# Patient Record
Sex: Female | Born: 1975 | Race: Black or African American | Hispanic: No | Marital: Single | Smoking: Never smoker
Health system: Southern US, Community
[De-identification: ages and names within clinical notes are randomized; demographics above are authoritative.]

## PROBLEM LIST (undated history)

## (undated) DIAGNOSIS — D509 Iron deficiency anemia, unspecified: Secondary | ICD-10-CM

## (undated) DIAGNOSIS — M069 Rheumatoid arthritis, unspecified: Secondary | ICD-10-CM

## (undated) DIAGNOSIS — R110 Nausea: Secondary | ICD-10-CM

## (undated) DIAGNOSIS — D649 Anemia, unspecified: Secondary | ICD-10-CM

## (undated) DIAGNOSIS — R10A2 Flank pain, left side: Secondary | ICD-10-CM

## (undated) HISTORY — PX: ABDOMINAL HYSTERECTOMY: SHX81

---

## 1998-10-12 HISTORY — PX: HYSTEROSCOPY WITH D & C: SHX1775

## 2013-03-30 DIAGNOSIS — Z0000 Encounter for general adult medical examination without abnormal findings: Secondary | ICD-10-CM | POA: Insufficient documentation

## 2016-05-11 DIAGNOSIS — Z1239 Encounter for other screening for malignant neoplasm of breast: Secondary | ICD-10-CM | POA: Insufficient documentation

## 2019-02-17 ENCOUNTER — Emergency Department (HOSPITAL_BASED_OUTPATIENT_CLINIC_OR_DEPARTMENT_OTHER): Payer: Self-pay

## 2019-02-17 ENCOUNTER — Other Ambulatory Visit: Payer: Self-pay

## 2019-02-17 ENCOUNTER — Emergency Department (HOSPITAL_BASED_OUTPATIENT_CLINIC_OR_DEPARTMENT_OTHER)
Admission: EM | Admit: 2019-02-17 | Discharge: 2019-02-17 | Disposition: A | Discharge status: Home / Self Care | Payer: Self-pay | Attending: Emergency Medicine | Admitting: Emergency Medicine

## 2019-02-17 ENCOUNTER — Encounter (HOSPITAL_BASED_OUTPATIENT_CLINIC_OR_DEPARTMENT_OTHER): Payer: Self-pay | Admitting: Emergency Medicine

## 2019-02-17 DIAGNOSIS — Y999 Unspecified external cause status: Secondary | ICD-10-CM | POA: Diagnosis not present

## 2019-02-17 DIAGNOSIS — Y9389 Activity, other specified: Secondary | ICD-10-CM | POA: Diagnosis not present

## 2019-02-17 DIAGNOSIS — R0789 Other chest pain: Secondary | ICD-10-CM | POA: Insufficient documentation

## 2019-02-17 DIAGNOSIS — Y92410 Unspecified street and highway as the place of occurrence of the external cause: Secondary | ICD-10-CM | POA: Insufficient documentation

## 2019-02-17 DIAGNOSIS — S299XXA Unspecified injury of thorax, initial encounter: Secondary | ICD-10-CM | POA: Diagnosis present

## 2019-02-17 HISTORY — DX: Anemia, unspecified: D64.9

## 2019-02-17 NOTE — ED Notes (Signed)
ED Provider at bedside. 

## 2019-02-17 NOTE — Discharge Instructions (Addendum)
Today your chest x-ray was normal.  We discussed at length of the evaluation of your symptoms today.  Given that you only have pain in your chest when the area is touched, and do not have other symptoms or concerns chest x-ray was obtained.  If you develop other concerns, or this pain changes, you develop shortness of breath, cough or the pain moves significantly please seek additional medical care and evaluation.  I offered you muscle relaxers today which you declined.  We discussed that you most likely will be sore on the backs of your shoulders and on the sides in your lower back tomorrow.  Please take Ibuprofen (Advil, motrin) and Tylenol (acetaminophen) to relieve your pain.  You may take up to 600 MG (3 pills) of normal strength ibuprofen every 8 hours as needed.  In between doses of ibuprofen you make take tylenol, up to 1,000 mg (two extra strength pills).  Do not take more than 3,000 mg tylenol in a 24 hour period.  Please check all medication labels as many medications such as pain and cold medications may contain tylenol.  Do not drink alcohol while taking these medications.  Do not take other NSAID'S while taking ibuprofen (such as aleve or naproxen).  Please take ibuprofen with food to decrease stomach upset.
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
## 2020-06-04 DIAGNOSIS — R0989 Other specified symptoms and signs involving the circulatory and respiratory systems: Secondary | ICD-10-CM | POA: Insufficient documentation

## 2020-10-17 DIAGNOSIS — Z9189 Other specified personal risk factors, not elsewhere classified: Secondary | ICD-10-CM | POA: Insufficient documentation

## 2020-11-06 DIAGNOSIS — K219 Gastro-esophageal reflux disease without esophagitis: Secondary | ICD-10-CM | POA: Insufficient documentation

## 2020-12-10 HISTORY — PX: COLONOSCOPY WITH PROPOFOL: SHX5780
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
## 2021-02-12 DIAGNOSIS — Z8742 Personal history of other diseases of the female genital tract: Secondary | ICD-10-CM | POA: Insufficient documentation

## 2021-02-12 HISTORY — PX: TOTAL LAPAROSCOPIC HYSTERECTOMY WITH SALPINGECTOMY: SHX6742

## 2021-02-23 HISTORY — PX: EXAMINATION UNDER ANESTHESIA: SHX1540

## 2021-09-25 DIAGNOSIS — Z803 Family history of malignant neoplasm of breast: Secondary | ICD-10-CM | POA: Insufficient documentation

## 2024-06-12 DIAGNOSIS — N201 Calculus of ureter: Secondary | ICD-10-CM

## 2024-06-12 HISTORY — DX: Calculus of ureter: N20.1

## 2024-07-10 ENCOUNTER — Emergency Department (HOSPITAL_BASED_OUTPATIENT_CLINIC_OR_DEPARTMENT_OTHER)
Admission: EM | Admit: 2024-07-10 | Discharge: 2024-07-10 | Disposition: A | Discharge status: Home / Self Care | Attending: Emergency Medicine | Admitting: Emergency Medicine

## 2024-07-10 ENCOUNTER — Encounter (HOSPITAL_BASED_OUTPATIENT_CLINIC_OR_DEPARTMENT_OTHER): Payer: Self-pay

## 2024-07-10 ENCOUNTER — Other Ambulatory Visit: Payer: Self-pay

## 2024-07-10 ENCOUNTER — Emergency Department (HOSPITAL_BASED_OUTPATIENT_CLINIC_OR_DEPARTMENT_OTHER)

## 2024-07-10 DIAGNOSIS — R072 Precordial pain: Secondary | ICD-10-CM | POA: Insufficient documentation

## 2024-07-10 DIAGNOSIS — R079 Chest pain, unspecified: Secondary | ICD-10-CM | POA: Diagnosis present

## 2024-07-10 DIAGNOSIS — Z8739 Personal history of other diseases of the musculoskeletal system and connective tissue: Secondary | ICD-10-CM | POA: Diagnosis not present

## 2024-07-10 LAB — BASIC METABOLIC PANEL WITH GFR
Anion gap: 10 (ref 5–15)
BUN: 7 mg/dL (ref 6–20)
CO2: 23 mmol/L (ref 22–32)
Calcium: 8.9 mg/dL (ref 8.9–10.3)
Chloride: 104 mmol/L (ref 98–111)
Creatinine, Ser: 0.66 mg/dL (ref 0.44–1.00)
GFR, Estimated: >60 mL/min (ref >60)
Glucose, Bld: 83 mg/dL (ref 70–99)
Potassium: 3.9 mmol/L (ref 3.5–5.1)
Sodium: 137 mmol/L (ref 135–145)

## 2024-07-10 LAB — CBC
HCT: 40.3 % (ref 36.0–46.0)
Hemoglobin: 13.9 g/dL (ref 12.0–15.0)
MCH: 30.6 pg (ref 26.0–34.0)
MCHC: 34.5 g/dL (ref 30.0–36.0)
MCV: 88.8 fL (ref 80.0–100.0)
Platelets: 199 K/uL (ref 150–400)
RBC: 4.54 MIL/uL (ref 3.87–5.11)
RDW: 12 % (ref 11.5–15.5)
WBC: 6.2 K/uL (ref 4.0–10.5)
nRBC: 0 % (ref 0.0–0.2)

## 2024-07-10 LAB — TROPONIN T, HIGH SENSITIVITY
Troponin T High Sensitivity: <15 ng/L (ref 0–19)
Troponin T High Sensitivity: <15 ng/L (ref 0–19)

## 2024-07-10 MED ORDER — FAMOTIDINE 20 MG PO TABS
20.0000 mg | ORAL_TABLET | Freq: Once | ORAL | Status: AC
  Administered 2024-07-10: 20 mg via ORAL
  Filled 2024-07-10: qty 1

## 2024-07-10 MED ORDER — ACETAMINOPHEN 500 MG PO TABS
1000.0000 mg | ORAL_TABLET | Freq: Once | ORAL | Status: AC
  Administered 2024-07-10: 1000 mg via ORAL
  Filled 2024-07-10: qty 2

## 2024-07-10 MED ORDER — ALUM & MAG HYDROXIDE-SIMETH 200-200-20 MG/5ML PO SUSP
30.0000 mL | Freq: Once | ORAL | Status: AC
  Administered 2024-07-10: 30 mL via ORAL
  Filled 2024-07-10: qty 30

## 2024-07-10 NOTE — ED Notes (Signed)
 Per provider orders, second troponin was drawn early.

## 2024-07-10 NOTE — ED Provider Notes (Signed)
 [facility] Provider Note   CSN: [id number] Arrival date & time: 07/10/24  1512     Patient presents with: Chest Pain   Shari Gay is a 48 y.o. female.  {Add pertinent medical, surgical, social history, OB history to HPI:32947} Pt c/o pain to mid chest onset at rest around noon today. Constant, dull pain, non radiating, not pleuritic, not changed whether upright or supine, not changed by activity level. No associated sob, nv or diaphoresis. Denies recurrent similar pain. No radiation of pain or back, neck or flank pain. No chest wall injury or strain. No heartburn/indigestion. No exertional chest pain or discomfort. No unusual fatigue or doe. No cough or uri symptoms. No fever or chills. No hx heart disease and denies family hx heart disease. No leg pain or swelling. No hx dvt or pe. Only recent change in meds is plaquenil. No rash or skin lesions. No new or worsening joint pain or swelling, nvd or gi symptoms, no eye complaints, no itching or rash or mm lesions, no dizziness or new fatigue.   The history is provided by the patient and medical records.  Chest Pain Associated symptoms: no abdominal pain, no back pain, no cough, no fever, no headache, no nausea, no palpitations, no shortness of breath and no vomiting        Prior to Admission medications   Not on File    Allergies: Iron    Review of Systems  Constitutional:  Negative for chills and fever.  HENT:  Negative for sore throat.   Respiratory:  Negative for cough and shortness of breath.   Cardiovascular:  Positive for chest pain. Negative for palpitations and leg swelling.  Gastrointestinal:  Negative for abdominal pain, nausea and vomiting.  Genitourinary:  Negative for flank pain.  Musculoskeletal:  Negative for back pain and neck pain.  Skin:  Negative for rash.  Neurological:  Negative for headaches.    Updated Vital Signs BP (!) 144/88 (BP Location: Right Arm)    Pulse 79   Temp 97.9 F (36.6 C) (Oral)   Resp (!) 23   Ht 1.626 m (5' 4)   Wt 99.8 kg   SpO2 100%   BMI 37.76 kg/m   Physical Exam Vitals and nursing note reviewed.  Constitutional:      Appearance: Normal appearance. She is well-developed.  HENT:     Head: Atraumatic.     Nose: Nose normal.     Mouth/Throat:     Mouth: Mucous membranes are moist.     Pharynx: Oropharynx is clear. No oropharyngeal exudate or posterior oropharyngeal erythema.  Eyes:     General: No scleral icterus.    Conjunctiva/sclera: Conjunctivae normal.  Neck:     Trachea: No tracheal deviation.  Cardiovascular:     Rate and Rhythm: Normal rate and regular rhythm.     Pulses: Normal pulses.     Heart sounds: Normal heart sounds. No murmur heard.    No friction rub. No gallop.  Pulmonary:     Effort: Pulmonary effort is normal. No respiratory distress.     Breath sounds: Normal breath sounds.  Chest:     Chest wall: No tenderness.  Abdominal:     General: Bowel sounds are normal. There is no distension.     Palpations: Abdomen is soft. There is no mass.     Tenderness: There is no abdominal tenderness. There is no guarding.  Musculoskeletal:  General: No swelling or tenderness.     Cervical back: Normal range of motion and neck supple. No rigidity. No muscular tenderness.     Right lower leg: No edema.     Left lower leg: No edema.  Skin:    General: Skin is warm and dry.     Findings: No rash.  Neurological:     Mental Status: She is alert.     Comments: Alert, speech normal. Motor/sens grossly intact bil.   Psychiatric:        Mood and Affect: Mood normal.     (all labs ordered are listed, but only abnormal results are displayed) Results for orders placed or performed during the hospital encounter of 07/10/24  Basic metabolic panel   Collection Time: 07/10/24  3:38 PM  Result Value Ref Range   Sodium 137 135 - 145 mmol/L   Potassium 3.9 3.5 - 5.1 mmol/L   Chloride 104 98 -  111 mmol/L   CO2 23 22 - 32 mmol/L   Glucose, Bld 83 70 - 99 mg/dL   BUN 7 6 - 20 mg/dL   Creatinine, Ser 9.33 0.44 - 1.00 mg/dL   Calcium 8.9 8.9 - 89.6 mg/dL   GFR, Estimated >39 >39 mL/min   Anion gap 10 5 - 15  CBC   Collection Time: 07/10/24  3:38 PM  Result Value Ref Range   WBC 6.2 4.0 - 10.5 K/uL   RBC 4.54 3.87 - 5.11 MIL/uL   Hemoglobin 13.9 12.0 - 15.0 g/dL   HCT 59.6 63.9 - 53.9 %   MCV 88.8 80.0 - 100.0 fL   MCH 30.6 26.0 - 34.0 pg   MCHC 34.5 30.0 - 36.0 g/dL   RDW 87.9 88.4 - 84.4 %   Platelets 199 150 - 400 K/uL   nRBC 0.0 0.0 - 0.2 %  Troponin T, High Sensitivity   Collection Time: 07/10/24  3:38 PM  Result Value Ref Range   Troponin T High Sensitivity <15 0 - 19 ng/L  Troponin T, High Sensitivity   Collection Time: 07/10/24  5:10 PM  Result Value Ref Range   Troponin T High Sensitivity <15 0 - 19 ng/L   DG Chest 2 View Result Date: 07/10/2024 CLINICAL DATA:  Upper chest pain. EXAM: CHEST - 2 VIEW COMPARISON:  02/17/2019 FINDINGS: Lungs are adequately inflated without focal airspace consolidation or effusion. Cardiomediastinal silhouette and remainder of the exam is unchanged. IMPRESSION: No active cardiopulmonary disease. Electronically Signed   By: Toribio Agreste M.D.   On: 07/10/2024 15:56    EKG: EKG Interpretation Date/Time:  Monday July 10 2024 15:29:20 EDT Ventricular Rate:  80 PR Interval:  175 QRS Duration:  104 QT Interval:  367 QTC Calculation: 424 R Axis:   51  Text Interpretation: Sinus rhythm Confirmed by Bernard Drivers ([id number]) on 07/10/2024 4:06:12 PM  Radiology: DG Chest 2 View Result Date: 07/10/2024 CLINICAL DATA:  Upper chest pain. EXAM: CHEST - 2 VIEW COMPARISON:  02/17/2019 FINDINGS: Lungs are adequately inflated without focal airspace consolidation or effusion. Cardiomediastinal silhouette and remainder of the exam is unchanged. IMPRESSION: No active cardiopulmonary disease. Electronically Signed   By: Toribio Agreste M.D.   On:  07/10/2024 15:56    {Document cardiac monitor, telemetry assessment procedure when appropriate:32947} Procedures   Medications Ordered in the ED  acetaminophen (TYLENOL) tablet 1,000 mg (1,000 mg Oral Given 07/10/24 1619)  alum & mag hydroxide-simeth (MAALOX/MYLANTA) 200-200-20 MG/5ML suspension 30 mL (30 mLs Oral Given 07/10/24  1619)  famotidine (PEPCID) tablet 20 mg (20 mg Oral Given 07/10/24 1619)      {Click here for ABCD2, HEART and other calculators REFRESH Note before signing:1}                              Medical Decision Making Problems Addressed: Precordial chest pain: acute illness or injury with systemic symptoms that poses a threat to life or bodily functions  Amount and/or Complexity of Data Reviewed External Data Reviewed: notes. Labs: ordered. Decision-making details documented in ED Course. Radiology: ordered and independent interpretation performed. Decision-making details documented in ED Course. ECG/medicine tests: ordered and independent interpretation performed. Decision-making details documented in ED Course.  Risk OTC drugs. Prescription drug management. Decision regarding hospitalization.   Iv ns. Continuous pulse ox and cardiac monitoring. Labs ordered/sent. Imaging ordered.   Differential diagnosis includes acs, msk cp, gi cp, etc. Dispo decision including potential need for admission considered - will get labs and imaging and reassess.   Reviewed nursing notes and prior charts for additional history. External reports reviewed.  Meds for symptom relief.   Cardiac monitor: sinus rhythm, rate 78.  Labs reviewed/interpreted by me - wbc and hgb normal.   Xrays reviewed/interpreted by me - no pna.   Additional labs reviewed/interpreted by me - delta trop normal/not increased - after symptoms present for 5-6 hours, felt not c/w acs.   Recheck pt comfortable, no acute distress and appears to be breathing comfortably.  Currently hr is 70, rr 14, and  pulse ox 100% on room air.  Pt does now note a more pleuritic quality to her chest pain, midline chest pain. No change in pain/symptoms whether upright or supine. No neck/back/flank pain.  Ddimer added to labs.   Pt indicates to rn does not want ddimer or other testing - requests d/c.   Pt does not want to stay longer, and requests d/c, discussed risks, including PE, and risk of PE, including permanent lung damage, death.   Pt is alert, oriented, and understands recommendations/risk and is electing to leave AMA.   Pt requests d/c and declines additional lab or imaging testing.  Will refer to close cardiology f/u, and rec if concerned with rxn to plaquenil holding that med until close pcp/rheum f/u.  Pt electing to be discharged AMA prior to completion of recommended ED eval, understanding risks.       {Document critical care time when appropriate  Document review of labs and clinical decision tools ie CHADS2VASC2, etc  Document your independent review of radiology images and any outside records  Document your discussion with family members, caretakers and with consultants  Document social determinants of health affecting pt's care  Document your decision making why or why not admission, treatments were needed:32947:::1}   Final diagnoses:  Precordial chest pain    ED Discharge Orders          Ordered    Ambulatory referral to Cardiology       Comments: If you have not heard from the Cardiology office within the next 72 hours please call [phone].   07/10/24 1749

## 2024-07-10 NOTE — ED Triage Notes (Signed)
 Pt reports centralized upper chest pain radiating to right side of chest (sore) starting around noon while sitting in line at school. Pt reports pain came on gradually and has progressively worsened since. PT reports pain worse with inspiration. No acute respiratory distress noted. No cardiac history reported. Pt states she started taking plaquenil on Friday for rheumatoid arthritis.

## 2024-07-10 NOTE — Discharge Instructions (Addendum)
 It was our pleasure to provide your ER care today - we hope that you feel better. Return if you reconsider and wish to have additional lab and/or imaging testing.   For recent chest pain, follow up closely with cardiologist in the next 1-2 weeks - we made referral, and they should be contacting you with an appointment in the next few days.  If concerned about possible reaction to your new med/plaquenil, hold/do not take for now, and follow up closely with your doctor/rheumatologist for recheck in the next few days.   Return to ER right away if worse, new symptoms, fevers, recurrent/persistent chest pain, increased trouble breathing, weak/fainting, rash/skin lesions, or other concern.

## 2024-07-13 ENCOUNTER — Emergency Department (HOSPITAL_BASED_OUTPATIENT_CLINIC_OR_DEPARTMENT_OTHER)
Admission: EM | Admit: 2024-07-13 | Discharge: 2024-07-13 | Disposition: A | Discharge status: Home / Self Care | Attending: Emergency Medicine | Admitting: Emergency Medicine

## 2024-07-13 ENCOUNTER — Other Ambulatory Visit: Payer: Self-pay

## 2024-07-13 ENCOUNTER — Emergency Department (HOSPITAL_BASED_OUTPATIENT_CLINIC_OR_DEPARTMENT_OTHER)

## 2024-07-13 ENCOUNTER — Encounter (HOSPITAL_BASED_OUTPATIENT_CLINIC_OR_DEPARTMENT_OTHER): Payer: Self-pay | Admitting: Emergency Medicine

## 2024-07-13 DIAGNOSIS — N201 Calculus of ureter: Secondary | ICD-10-CM

## 2024-07-13 DIAGNOSIS — N132 Hydronephrosis with renal and ureteral calculous obstruction: Secondary | ICD-10-CM | POA: Diagnosis not present

## 2024-07-13 DIAGNOSIS — R1032 Left lower quadrant pain: Secondary | ICD-10-CM | POA: Diagnosis present

## 2024-07-13 HISTORY — DX: Rheumatoid arthritis, unspecified: M06.9

## 2024-07-13 LAB — URINALYSIS, ROUTINE W REFLEX MICROSCOPIC
Bilirubin Urine: NEGATIVE
Glucose, UA: NEGATIVE mg/dL
Hgb urine dipstick: NEGATIVE
Ketones, ur: 40 mg/dL — AB
Leukocytes,Ua: NEGATIVE
Nitrite: NEGATIVE
Protein, ur: 30 mg/dL — AB
Specific Gravity, Urine: 1.02 (ref 1.005–1.030)
pH: 8.5 — ABNORMAL HIGH (ref 5.0–8.0)

## 2024-07-13 LAB — CBC
HCT: 41.5 % (ref 36.0–46.0)
Hemoglobin: 14.8 g/dL (ref 12.0–15.0)
MCH: 30.4 pg (ref 26.0–34.0)
MCHC: 35.7 g/dL (ref 30.0–36.0)
MCV: 85.2 fL (ref 80.0–100.0)
Platelets: 226 K/uL (ref 150–400)
RBC: 4.87 MIL/uL (ref 3.87–5.11)
RDW: 11.5 % (ref 11.5–15.5)
WBC: 7.4 K/uL (ref 4.0–10.5)
nRBC: 0 % (ref 0.0–0.2)

## 2024-07-13 LAB — COMPREHENSIVE METABOLIC PANEL WITH GFR
ALT: 9 U/L (ref 0–44)
AST: 23 U/L (ref 15–41)
Albumin: 4.2 g/dL (ref 3.5–5.0)
Alkaline Phosphatase: 88 U/L (ref 38–126)
Anion gap: 14 (ref 5–15)
BUN: 12 mg/dL (ref 6–20)
CO2: 19 mmol/L — ABNORMAL LOW (ref 22–32)
Calcium: 9.3 mg/dL (ref 8.9–10.3)
Chloride: 103 mmol/L (ref 98–111)
Creatinine, Ser: 0.99 mg/dL (ref 0.44–1.00)
GFR, Estimated: >60 mL/min (ref >60)
Glucose, Bld: 132 mg/dL — ABNORMAL HIGH (ref 70–99)
Potassium: 3.8 mmol/L (ref 3.5–5.1)
Sodium: 135 mmol/L (ref 135–145)
Total Bilirubin: 0.7 mg/dL (ref 0.0–1.2)
Total Protein: 7.4 g/dL (ref 6.5–8.1)

## 2024-07-13 LAB — LIPASE, BLOOD: Lipase: 14 U/L (ref 11–51)

## 2024-07-13 LAB — URINALYSIS, MICROSCOPIC (REFLEX)

## 2024-07-13 MED ORDER — MORPHINE SULFATE (PF) 4 MG/ML IV SOLN
4.0000 mg | Freq: Once | INTRAVENOUS | Status: AC
  Administered 2024-07-13: 4 mg via INTRAVENOUS
  Filled 2024-07-13: qty 1

## 2024-07-13 MED ORDER — ONDANSETRON 4 MG PO TBDP: 4.0000 mg | ORAL_TABLET | Freq: Three times a day (TID) | ORAL | 0 refills | Status: DC | PRN

## 2024-07-13 MED ORDER — SODIUM CHLORIDE 0.9 % IV BOLUS
1000.0000 mL | Freq: Once | INTRAVENOUS | Status: AC
  Administered 2024-07-13: 1000 mL via INTRAVENOUS

## 2024-07-13 MED ORDER — KETOROLAC TROMETHAMINE 30 MG/ML IJ SOLN
30.0000 mg | Freq: Once | INTRAMUSCULAR | Status: DC
Start: 2024-07-13 — End: 2024-07-13
  Filled 2024-07-13: qty 1

## 2024-07-13 MED ORDER — ONDANSETRON HCL 4 MG/2ML IJ SOLN
4.0000 mg | Freq: Once | INTRAMUSCULAR | Status: AC
  Administered 2024-07-13: 4 mg via INTRAVENOUS
  Filled 2024-07-13: qty 2

## 2024-07-13 MED ORDER — OXYCODONE-ACETAMINOPHEN 5-325 MG PO TABS
1.0000 | ORAL_TABLET | Freq: Once | ORAL | Status: AC
  Administered 2024-07-13: 1 via ORAL
  Filled 2024-07-13: qty 1

## 2024-07-13 MED ORDER — ONDANSETRON 4 MG PO TBDP
8.0000 mg | ORAL_TABLET | Freq: Once | ORAL | Status: AC
  Administered 2024-07-13: 8 mg via ORAL
  Filled 2024-07-13: qty 2

## 2024-07-13 MED ORDER — OXYCODONE-ACETAMINOPHEN 5-325 MG PO TABS: 1.0000 | ORAL_TABLET | Freq: Four times a day (QID) | ORAL | 0 refills | Status: DC | PRN

## 2024-07-13 MED ORDER — HYDROMORPHONE HCL 1 MG/ML IJ SOLN
1.0000 mg | Freq: Once | INTRAMUSCULAR | Status: AC
  Administered 2024-07-13: 1 mg via INTRAVENOUS
  Filled 2024-07-13: qty 1

## 2024-07-13 MED ORDER — TAMSULOSIN HCL 0.4 MG PO CAPS: 0.4000 mg | ORAL_CAPSULE | Freq: Every day | ORAL | 0 refills | Status: DC

## 2024-07-13 MED ORDER — IOHEXOL 300 MG/ML  SOLN
100.0000 mL | Freq: Once | INTRAMUSCULAR | Status: AC | PRN
  Administered 2024-07-13: 100 mL via INTRAVENOUS

## 2024-07-13 NOTE — ED Notes (Signed)
 Patient transported to CT

## 2024-07-13 NOTE — ED Notes (Signed)
 ED Provider at bedside.

## 2024-07-13 NOTE — Discharge Instructions (Signed)
 You were seen in the emergency department for some left-sided abdominal pain into your back.  You have signs of a kidney stone in the tube going from your left kidney to your bladder.  We are prescribing you some narcotic pain medicine along with medicine for nausea and to help the stone pass.  Please drink plenty of fluids.  You should also take medicine for constipation as the narcotic pain medicine will make you more constipated.  Contact [facility] for follow-up.  Return to the emergency department if any fever or uncontrolled pain.

## 2024-07-13 NOTE — ED Provider Notes (Signed)
 [facility] Provider Note   CSN: [id number] Arrival date & time: 07/13/24  9178     Patient presents with: Abdominal Pain   Shari Gay is a 48 y.o. female.  She is complaining of gradual onset of left-sided abdominal pain radiating to her left back that started around 11 PM last night.  Acutely worsened around 1 AM and has had multiple episodes of vomiting.  Thought might be constipated so took a Ducolax and 2 enemas with resultant bowel movement but no improvement in pain.  No chest pain or shortness of breath.  No fevers or chills.  No urinary symptoms.  He has a little bit of pain complaint this morning, prior hysterectomy.  No vaginal discharge.  Prior abdominal surgeries include laparoscopic hysterectomy and prior c section.     Abdominal Pain Pain location:  LLQ Pain quality: aching   Pain radiates to:  Back Pain severity:  Severe Onset quality:  Gradual Duration:  12 hours Timing:  Constant Progression:  Worsening Chronicity:  New Context: not trauma   Relieved by:  Nothing Ineffective treatments:  Bowel activity Associated symptoms: nausea and vomiting   Associated symptoms: no chest pain, no constipation, no cough, no diarrhea, no dysuria, no fever, no hematemesis and no shortness of breath        Prior to Admission medications   Not on File    Allergies: Iron    Review of Systems  Constitutional:  Negative for fever.  Respiratory:  Negative for cough and shortness of breath.   Cardiovascular:  Negative for chest pain.  Gastrointestinal:  Positive for abdominal pain, nausea and vomiting. Negative for constipation, diarrhea and hematemesis.  Genitourinary:  Negative for dysuria.    Updated Vital Signs BP (!) 143/96 (BP Location: Right Arm)   Pulse 78   Temp 98.8 F (37.1 C) (Oral)   Resp 18   Ht 5' 4 (1.626 m)   Wt 97.5 kg   LMP 02/13/2019   SpO2 100%   BMI 36.90 kg/m   Physical Exam Vitals and  nursing note reviewed.  Constitutional:      General: She is not in acute distress.    Appearance: Normal appearance. She is well-developed.  HENT:     Head: Normocephalic and atraumatic.  Eyes:     Conjunctiva/sclera: Conjunctivae normal.  Cardiovascular:     Rate and Rhythm: Normal rate and regular rhythm.     Heart sounds: No murmur heard. Pulmonary:     Effort: Pulmonary effort is normal. No respiratory distress.     Breath sounds: Normal breath sounds. No stridor. No wheezing.  Abdominal:     Palpations: Abdomen is soft.     Tenderness: There is no abdominal tenderness. There is no guarding or rebound.  Musculoskeletal:        General: No deformity.     Cervical back: Neck supple.  Skin:    General: Skin is warm and dry.  Neurological:     General: No focal deficit present.     Mental Status: She is alert.     GCS: GCS eye subscore is 4. GCS verbal subscore is 5. GCS motor subscore is 6.     (all labs ordered are listed, but only abnormal results are displayed) Labs Reviewed  COMPREHENSIVE METABOLIC PANEL WITH GFR - Abnormal; Notable for the following components:      Result Value   CO2 19 (*)    Glucose, Bld 132 (*)  All other components within normal limits  URINALYSIS, ROUTINE W REFLEX MICROSCOPIC - Abnormal; Notable for the following components:   pH 8.5 (*)    Ketones, ur 40 (*)    Protein, ur 30 (*)    All other components within normal limits  URINALYSIS, MICROSCOPIC (REFLEX) - Abnormal; Notable for the following components:   Bacteria, UA RARE (*)    All other components within normal limits  LIPASE, BLOOD  CBC    EKG: None  Radiology: CT ABDOMEN PELVIS W CONTRAST Result Date: 07/13/2024 CLINICAL DATA:  Abdominal pain, back pain and vomiting. EXAM: CT ABDOMEN AND PELVIS WITH CONTRAST TECHNIQUE: Multidetector CT imaging of the abdomen and pelvis was performed using the standard protocol following bolus administration of intravenous contrast.  RADIATION DOSE REDUCTION: This exam was performed according to the departmental dose-optimization program which includes automated exposure control, adjustment of the mA and/or kV according to patient size and/or use of iterative reconstruction technique. CONTRAST:  100mL OMNIPAQUE IOHEXOL 300 MG/ML  SOLN COMPARISON:  None Available. FINDINGS: Lower chest: No acute abnormality. Hepatobiliary: No focal liver abnormality is seen. No gallstones, gallbladder wall thickening, or biliary dilatation. Pancreas: Unremarkable. No pancreatic ductal dilatation or surrounding inflammatory changes. Spleen: Normal in size without focal abnormality. Adrenals/Urinary Tract: Adrenal masses. Mild left hydronephrosis secondary to a calculus in the proximal left ureter measuring approximately 7 x 4 x 6 mm. No additional calculi identified bilaterally. The bladder is nearly decompressed. Stomach/Bowel: Bowel shows no evidence of obstruction, ileus, inflammation or lesion. The appendix is normal. No free intraperitoneal air. Moderate fecal material in the colon. Vascular/Lymphatic: No significant vascular findings are present. No enlarged abdominal or pelvic lymph nodes. Reproductive: Status post hysterectomy. No adnexal masses. Other: No abdominal wall hernia or abnormality. No abdominopelvic ascites. Musculoskeletal: Degenerative disc disease at L5-S1 IMPRESSION: Mild left hydronephrosis secondary to a 7 x 4 x 6 mm calculus in the proximal left ureter. Electronically Signed   By: Marcey Moan M.D.   On: 07/13/2024 10:44     Procedures   Medications Ordered in the ED  sodium chloride 0.9 % bolus 1,000 mL (0 mLs Intravenous Stopped 07/13/24 1012)  ondansetron (ZOFRAN) injection 4 mg (4 mg Intravenous Given 07/13/24 0853)  morphine (PF) 4 MG/ML injection 4 mg (4 mg Intravenous Given 07/13/24 0853)  HYDROmorphone (DILAUDID) injection 1 mg (1 mg Intravenous Given 07/13/24 0939)  iohexol (OMNIPAQUE) 300 MG/ML solution 100 mL (100  mLs Intravenous Contrast Given 07/13/24 0955)  ondansetron (ZOFRAN) injection 4 mg (4 mg Intravenous Given 07/13/24 1050)  oxyCODONE-acetaminophen (PERCOCET/ROXICET) 5-325 MG per tablet 1 tablet (1 tablet Oral Given 07/13/24 1244)  ondansetron (ZOFRAN-ODT) disintegrating tablet 8 mg (8 mg Oral Given 07/13/24 1244)    Clinical Course as of 07/13/24 1826  Thu Jul 13, 2024  1114 Patient CT showing 7 mm stone on the left.  Reviewed with patient.  She is p.o. trialing now. [MB]    Clinical Course User Index [MB] Towana Ozell BROCKS, MD                                 Medical Decision Making Amount and/or Complexity of Data Reviewed Labs: ordered. Radiology: ordered.  Risk Prescription drug management.   This patient complains of left-sided abdominal and back pain; this involves an extensive number of treatment Options and is a complaint that carries with it a high risk of complications and morbidity. The differential includes  colitis, diverticulitis, pyelonephritis, ureteral colic  I ordered, reviewed and interpreted labs, which included CBC normal, chemistries with low bicarb, urinalysis without signs of infection I ordered medication IV fluids pain medicine nausea medicine and reviewed PMP when indicated. I ordered imaging studies which included CT abdomen and pelvis and I independently    visualized and interpreted imaging which showed 7 mm proximal ureteral stone on left Previous records obtained and reviewed in epic no recent admissions Cardiac monitoring reviewed, sinus rhythm Social determinants considered, no significant barriers Critical Interventions: None  After the interventions stated above, I reevaluated the patient and found patient's pain to be improved and tolerating p.o. Admission and further testing considered, patient will trial outpatient management with pain medicine and outpatient urology.  Clear return instructions discussed.      Final diagnoses:  Ureteral  calculus, left    ED Discharge Orders          Ordered    oxyCODONE-acetaminophen (PERCOCET/ROXICET) 5-325 MG tablet  Every 6 hours PRN        07/13/24 1200    tamsulosin (FLOMAX) 0.4 MG CAPS capsule  Daily        07/13/24 1200    ondansetron (ZOFRAN-ODT) 4 MG disintegrating tablet  Every 8 hours PRN        07/13/24 1200               Towana Ozell BROCKS, MD 07/13/24 1827

## 2024-07-13 NOTE — ED Triage Notes (Signed)
 Pt reports abdominal pain for a few days. States that she took a dulcolax to get cleaned out. States that she has been vomiting and now yellowish liquid is coming up.

## 2024-07-16 ENCOUNTER — Encounter (HOSPITAL_COMMUNITY): Payer: Self-pay | Admitting: *Deleted

## 2024-07-16 ENCOUNTER — Other Ambulatory Visit: Payer: Self-pay

## 2024-07-16 ENCOUNTER — Emergency Department (HOSPITAL_COMMUNITY)
Admission: EM | Admit: 2024-07-16 | Discharge: 2024-07-17 | Discharge status: Against Medical Advice | Attending: Emergency Medicine | Admitting: Emergency Medicine

## 2024-07-16 DIAGNOSIS — N200 Calculus of kidney: Secondary | ICD-10-CM | POA: Diagnosis present

## 2024-07-16 DIAGNOSIS — Z5321 Procedure and treatment not carried out due to patient leaving prior to being seen by health care provider: Secondary | ICD-10-CM | POA: Diagnosis not present

## 2024-07-16 NOTE — ED Triage Notes (Signed)
 The pt has pain meds nausea meds with here she  just wants the kidney stone removed

## 2024-07-16 NOTE — ED Triage Notes (Signed)
Pt is on the phone

## 2024-07-16 NOTE — ED Triage Notes (Signed)
 The pt has had a kidney stone since she was seen at med center  one week ago and she was told to go to [place] [place] ed for further care  however she was brought by people that did not know the difference between here and [place].

## 2024-07-17 NOTE — ED Notes (Signed)
Pt left ED due to wait time.

## 2024-07-18 ENCOUNTER — Ambulatory Visit (HOSPITAL_COMMUNITY)
Admission: AD | Admit: 2024-07-18 | Discharge: 2024-07-18 | Disposition: A | Discharge status: Home / Self Care | Source: Ambulatory Visit | Attending: Urology | Admitting: Urology

## 2024-07-18 ENCOUNTER — Ambulatory Visit (HOSPITAL_COMMUNITY)
Admission: AD | Disposition: A | Discharge status: Home / Self Care | Payer: Self-pay | Source: Ambulatory Visit | Attending: Urology

## 2024-07-18 ENCOUNTER — Ambulatory Visit (HOSPITAL_COMMUNITY)

## 2024-07-18 ENCOUNTER — Other Ambulatory Visit: Payer: Self-pay | Admitting: Urology

## 2024-07-18 ENCOUNTER — Ambulatory Visit (HOSPITAL_COMMUNITY): Admitting: Anesthesiology

## 2024-07-18 ENCOUNTER — Encounter (HOSPITAL_COMMUNITY): Payer: Self-pay | Admitting: Urology

## 2024-07-18 ENCOUNTER — Ambulatory Visit (HOSPITAL_BASED_OUTPATIENT_CLINIC_OR_DEPARTMENT_OTHER): Admitting: Anesthesiology

## 2024-07-18 DIAGNOSIS — N132 Hydronephrosis with renal and ureteral calculous obstruction: Secondary | ICD-10-CM | POA: Insufficient documentation

## 2024-07-18 DIAGNOSIS — Z01818 Encounter for other preprocedural examination: Secondary | ICD-10-CM

## 2024-07-18 DIAGNOSIS — N201 Calculus of ureter: Secondary | ICD-10-CM | POA: Diagnosis not present

## 2024-07-18 HISTORY — DX: Flank pain, left side: R10.A2

## 2024-07-18 HISTORY — DX: Nausea: R11.0

## 2024-07-18 HISTORY — DX: Iron deficiency anemia, unspecified: D50.9

## 2024-07-18 HISTORY — PX: CYSTOSCOPY W/ URETERAL STENT PLACEMENT: SHX1429

## 2024-07-18 LAB — NO BLOOD PRODUCTS

## 2024-07-18 SURGERY — CYSTOSCOPY, WITH RETROGRADE PYELOGRAM AND URETERAL STENT INSERTION
Anesthesia: General | Laterality: Left

## 2024-07-18 MED ORDER — SODIUM CHLORIDE 0.9 % IR SOLN
Status: DC | PRN
  Administered 2024-07-18: 3000 mL via INTRAVESICAL

## 2024-07-18 MED ORDER — MIDAZOLAM HCL 2 MG/2ML IJ SOLN
INTRAMUSCULAR | Status: DC | PRN
Start: 2024-07-18 — End: 2024-07-18
  Administered 2024-07-18: 2 mg via INTRAVENOUS

## 2024-07-18 MED ORDER — PROPOFOL 10 MG/ML IV BOLUS
INTRAVENOUS | Status: DC | PRN
  Administered 2024-07-18: 200 mg via INTRAVENOUS

## 2024-07-18 MED ORDER — CEFAZOLIN SODIUM-DEXTROSE 2-4 GM/100ML-% IV SOLN
2.0000 g | INTRAVENOUS | Status: AC
  Administered 2024-07-18: 2 g via INTRAVENOUS

## 2024-07-18 MED ORDER — CHLORHEXIDINE GLUCONATE 0.12 % MT SOLN
OROMUCOSAL | Status: AC
  Filled 2024-07-18: qty 15

## 2024-07-18 MED ORDER — CEPHALEXIN 500 MG PO CAPS: 500.0000 mg | ORAL_CAPSULE | Freq: Every day | ORAL | 0 refills | Status: DC

## 2024-07-18 MED ORDER — KETOROLAC TROMETHAMINE 30 MG/ML IJ SOLN
INTRAMUSCULAR | Status: DC | PRN
Start: 2024-07-18 — End: 2024-07-18
  Administered 2024-07-18: 15 mg via INTRAVENOUS

## 2024-07-18 MED ORDER — MIDAZOLAM HCL 2 MG/2ML IJ SOLN
INTRAMUSCULAR | Status: AC
  Filled 2024-07-18: qty 2

## 2024-07-18 MED ORDER — OXYCODONE HCL 5 MG PO TABS: 5.0000 mg | ORAL_TABLET | Freq: Three times a day (TID) | ORAL | 0 refills | Status: DC | PRN

## 2024-07-18 MED ORDER — FENTANYL CITRATE (PF) 250 MCG/5ML IJ SOLN
INTRAMUSCULAR | Status: DC | PRN
  Administered 2024-07-18: 50 ug via INTRAVENOUS
  Administered 2024-07-18 (×2): 25 ug via INTRAVENOUS

## 2024-07-18 MED ORDER — 0.9 % SODIUM CHLORIDE (POUR BTL) OPTIME
TOPICAL | Status: DC | PRN
  Administered 2024-07-18: 1000 mL

## 2024-07-18 MED ORDER — ORAL CARE MOUTH RINSE: 15.0000 mL | Freq: Once | OROMUCOSAL | Status: AC

## 2024-07-18 MED ORDER — ONDANSETRON HCL 4 MG/2ML IJ SOLN
INTRAMUSCULAR | Status: DC | PRN
Start: 2024-07-18 — End: 2024-07-18
  Administered 2024-07-18: 4 mg via INTRAVENOUS

## 2024-07-18 MED ORDER — IOHEXOL 300 MG/ML  SOLN
INTRAMUSCULAR | Status: DC | PRN
  Administered 2024-07-18: 10 mL

## 2024-07-18 MED ORDER — CHLORHEXIDINE GLUCONATE 0.12 % MT SOLN
15.0000 mL | Freq: Once | OROMUCOSAL | Status: AC
  Administered 2024-07-18: 15 mL via OROMUCOSAL

## 2024-07-18 MED ORDER — CEFAZOLIN SODIUM-DEXTROSE 2-4 GM/100ML-% IV SOLN
INTRAVENOUS | Status: AC
  Filled 2024-07-18: qty 100

## 2024-07-18 MED ORDER — LIDOCAINE 2% (20 MG/ML) 5 ML SYRINGE
INTRAMUSCULAR | Status: DC | PRN
  Administered 2024-07-18: 60 mg via INTRAVENOUS

## 2024-07-18 MED ORDER — LACTATED RINGERS IV SOLN: INTRAVENOUS | Status: DC

## 2024-07-18 MED ORDER — DEXAMETHASONE SODIUM PHOSPHATE 10 MG/ML IJ SOLN
INTRAMUSCULAR | Status: DC | PRN
  Administered 2024-07-18: 10 mg via INTRAVENOUS

## 2024-07-18 MED ORDER — FENTANYL CITRATE (PF) 100 MCG/2ML IJ SOLN
INTRAMUSCULAR | Status: AC
  Filled 2024-07-18: qty 2

## 2024-07-18 SURGICAL SUPPLY — 21 items
BAG DRAIN URO-CYSTO SKYTR STRL (DRAIN) ×1 IMPLANT
BASKET ZERO TIP NITINOL 2.4FR (BASKET) IMPLANT
CATH URETL OPEN 5X70 (CATHETERS) ×1 IMPLANT
DRSG TEGADERM 4X4.75 (GAUZE/BANDAGES/DRESSINGS) IMPLANT
EXTRACTOR STONE 1.7FRX115CM (UROLOGICAL SUPPLIES) IMPLANT
FIBER LASER FLEXIVA PULSE BX (MISCELLANEOUS) IMPLANT
GLOVE BIO SURGEON STRL SZ 6.5 (GLOVE) ×1 IMPLANT
GOWN STRL REUS W/ TWL LRG LVL3 (GOWN DISPOSABLE) ×1 IMPLANT
GUIDEWIRE STR DUAL SENSOR (WIRE) ×1 IMPLANT
KIT TURNOVER KIT B (KITS) ×1 IMPLANT
MANIFOLD NEPTUNE II (INSTRUMENTS) ×1 IMPLANT
NS IRRIG 1000ML POUR BTL (IV SOLUTION) IMPLANT
PACK CYSTO (CUSTOM PROCEDURE TRAY) ×1 IMPLANT
SHEATH NAVIGATOR HD 11/13X28 (SHEATH) IMPLANT
SHEATH NAVIGATOR HD 11/13X36 (SHEATH) IMPLANT
SHEATH NAVIGATOR HD 12/14X46 (SHEATH) IMPLANT
SLEEVE SCD COMPRESS KNEE MED (STOCKING) ×1 IMPLANT
SOL .9 NS 3000ML IRR UROMATIC (IV SOLUTION) ×1 IMPLANT
STENT URET 6FRX24 CONTOUR (STENTS) IMPLANT
TUBE CONNECTING 12X1/4 (SUCTIONS) ×1 IMPLANT
TUBING UROLOGY SET (TUBING) ×1 IMPLANT

## 2024-07-18 NOTE — Discharge Instructions (Addendum)
 DISCHARGE INSTRUCTIONS FOR KIDNEY STONE/URETERAL STENT   MEDICATIONS:  1. Resume all your other meds from home  2. AZO over the counter can help with the burning/stinging when you urinate. 3. Oxycodone is for moderate/severe pain, otherwise taking up to 1000 mg every 6 hours of plainTylenol will help treat your pain.   4. Take Cephalexin daily until stent is removed.    ACTIVITY:  1. No strenuous activity x 1week  2. No driving while on narcotic pain medications  3. Drink plenty of water  4. Continue to walk at home - you can still get blood clots when you are at home, so keep active, but don't over do it.  5. May return to work/school tomorrow or when you feel ready   BATHING:  1. You can shower and we recommend daily showers  2. You have a string coming from your urethra: The stent string is attached to your ureteral stent. Do not pull on this.   SIGNS/SYMPTOMS TO CALL:  Please call us  if you have a fever greater than 101.5, uncontrolled nausea/vomiting, uncontrolled pain, dizziness, unable to urinate, bloody urine, chest pain, shortness of breath, leg swelling, leg pain, redness around wound, drainage from wound, or any other concerns or questions.   You can reach us  at [phone].   FOLLOW-UP:  1. You have a string attached to your stent, you may remove it on Friday, October 10. To do this, pull the string until the stent is completely removed. You may feel an odd sensation in your back.  Post Anesthesia Home Care Instructions  Activity: Get plenty of rest for the remainder of the day. A responsible adult should stay with you for 24 hours following the procedure.  For the next 24 hours, DO NOT: -Drive a car -Advertising copywriter -Drink alcoholic beverages -Take any medication unless instructed by your physician -Make any legal decisions or sign important papers.  Meals: Start with liquid foods such as gelatin or soup. Progress to regular foods as tolerated. Avoid greasy,  spicy, heavy foods. If nausea and/or vomiting occur, drink only clear liquids until the nausea and/or vomiting subsides. Call your physician if vomiting continues.  Special Instructions/Symptoms: Your throat may feel dry or sore from the anesthesia or the breathing tube placed in your throat during surgery. If this causes discomfort, gargle with warm salt water. The discomfort should disappear within 24 hours.

## 2024-07-18 NOTE — OR Nursing (Signed)
 No drainage from surgical site.  Patient voids x 3

## 2024-07-18 NOTE — Anesthesia Preprocedure Evaluation (Signed)
 Anesthesia Evaluation  Patient identified by MRN, date of birth, ID band Patient awake    Reviewed: Allergy & Precautions, NPO status , Patient's Chart, lab work & pertinent test results  Airway Mallampati: I  TM Distance: >3 FB Neck ROM: Full    Dental no notable dental hx. (+) Dental Advisory Given, Teeth Intact   Pulmonary neg pulmonary ROS   Pulmonary exam normal breath sounds clear to auscultation       Cardiovascular negative cardio ROS Normal cardiovascular exam Rhythm:Regular Rate:Normal     Neuro/Psych negative neurological ROS  negative psych ROS   GI/Hepatic negative GI ROS, Neg liver ROS,,,  Endo/Other  negative endocrine ROS    Renal/GU negative Renal ROS     Musculoskeletal  (+) Arthritis ,    Abdominal  (+) + obese  Peds  Hematology  (+) Blood dyscrasia, anemia , REFUSES BLOOD PRODUCTS, JEHOVAH'S WITNESS  Anesthesia Other Findings   Reproductive/Obstetrics                              Anesthesia Physical Anesthesia Plan  ASA: 2  Anesthesia Plan: General   Post-op Pain Management: Ofirmev IV (intra-op)*   Induction: Intravenous  PONV Risk Score and Plan: 4 or greater and Ondansetron, Dexamethasone, Treatment may vary due to age or medical condition and Midazolam  Airway Management Planned: LMA  Additional Equipment:   Intra-op Plan:   Post-operative Plan: Extubation in OR  Informed Consent: I have reviewed the patients History and Physical, chart, labs and discussed the procedure including the risks, benefits and alternatives for the proposed anesthesia with the patient or authorized representative who has indicated his/her understanding and acceptance.     Dental advisory given  Plan Discussed with: CRNA  Anesthesia Plan Comments:         Anesthesia Quick Evaluation

## 2024-07-18 NOTE — Transfer of Care (Signed)
 Immediate Anesthesia Transfer of Care Note  Patient: Shari Gay  Procedure(s) Performed: CYSTOSCOPY, WITH RETROGRADE PYELOGRAM, URETHRAL LASER LITHOTRIPSY, AND URETERAL STENT INSERTION (Left)  Patient Location: PACU  Anesthesia Type:General  Level of Consciousness: awake and alert   Airway & Oxygen Therapy: Patient Spontanous Breathing  Post-op Assessment: Report given to RN  Post vital signs: Reviewed and stable  Last Vitals:  Vitals Value Taken Time  BP    Temp 36.5 C 07/18/24 13:49  Pulse    Resp    SpO2      Last Pain:  Vitals:   07/18/24 1134  TempSrc: Oral  PainSc: 7       Patients Stated Pain Goal: 5 (07/18/24 1134)  Complications: No notable events documented.

## 2024-07-18 NOTE — Anesthesia Procedure Notes (Signed)
 Procedure Name: LMA Insertion Date/Time: 07/18/2024 1:01 PM  Performed by: Delores Duwaine SAUNDERS, CRNAPre-anesthesia Checklist: Patient identified, Emergency Drugs available, Suction available and Patient being monitored Patient Re-evaluated:Patient Re-evaluated prior to induction Oxygen Delivery Method: Circle System Utilized Preoxygenation: Pre-oxygenation with 100% oxygen Induction Type: IV induction Ventilation: Mask ventilation without difficulty LMA: LMA inserted LMA Size: 4.0 Number of attempts: 1 Airway Equipment and Method: Bite block Placement Confirmation: positive ETCO2 Tube secured with: Tape Dental Injury: Teeth and Oropharynx as per pre-operative assessment

## 2024-07-18 NOTE — Anesthesia Postprocedure Evaluation (Signed)
 Anesthesia Post Note  Patient: Shari Gay  Procedure(s) Performed: CYSTOSCOPY, WITH RETROGRADE PYELOGRAM, URETHRAL LASER LITHOTRIPSY, AND URETERAL STENT INSERTION (Left)     Patient location during evaluation: PACU Anesthesia Type: General Level of consciousness: awake Pain management: pain level controlled Vital Signs Assessment: post-procedure vital signs reviewed and stable Respiratory status: spontaneous breathing Cardiovascular status: blood pressure returned to baseline Postop Assessment: no apparent nausea or vomiting Anesthetic complications: no   No notable events documented.               Lauraine KATHEE [place]

## 2024-07-18 NOTE — Progress Notes (Signed)
 Spoke w/ via phone for pre-op interview--- pt  Lab needs dos----   no       Lab results------ pt has current lab results in epic dated 07-13-2024 CBC/ CMP COVID test -----patient states asymptomatic no test needed Arrive at ------- pt is here in waiting area,  add-on for 07-18-2024 NPO after MN w/ exception sips of water w/ meds Pre-Surgery Ensure or G2:  Med rec completed Medications to take morning of surgery ----- sips of water w/ meds at 0600 oxycodone/ zofran/ flomax Diabetic medication ----- n/a  GLP1 agonist last dose: n/a GLP1 instructions:  Patient instructed no nail polish to be worn day of surgery Patient instructed to bring photo id and insurance card day of surgery Patient aware to have Driver (ride ) / caregiver    for 24 hours after surgery - friend Patient Special Instructions ----- n/a Pre-Op special Instructions ----- case just added on today  Patient verbalized understanding of instructions that were given at this phone interview. Patient denies chest pain, sob, fever, cough at the interview.

## 2024-07-18 NOTE — H&P (Signed)
    History of present illness:48 year old female who presented to the ED with left flank pain found to have a 7 mm left proximal ureteral calculus.  This is patient's first stone episode.  Her pain is not well-controlled.   Review of systems: A 12 point comprehensive review of systems was obtained and is negative unless otherwise stated in the history of present illness.  There are no active problems to display for this patient.   No current facility-administered medications on file prior to encounter.   Current Outpatient Medications on File Prior to Encounter  Medication Sig Dispense Refill   ondansetron (ZOFRAN-ODT) 4 MG disintegrating tablet Take 1 tablet (4 mg total) by mouth every 8 (eight) hours as needed for nausea or vomiting. 20 tablet 0   oxyCODONE-acetaminophen (PERCOCET/ROXICET) 5-325 MG tablet Take 1 tablet by mouth every 6 (six) hours as needed for severe pain (pain score 7-10). 15 tablet 0   tamsulosin (FLOMAX) 0.4 MG CAPS capsule Take 1 capsule (0.4 mg total) by mouth daily. 30 capsule 0    Past Medical History:  Diagnosis Date   Anemia    RA (rheumatoid arthritis) (HCC)     Past Surgical History:  Procedure Laterality Date   ABDOMINAL HYSTERECTOMY      Social History   Tobacco Use   Smoking status: Never   Smokeless tobacco: Never  Substance Use Topics   Alcohol use: Never   Drug use: Never    No family history on file.  PE: There were no vitals filed for this visit. Patient appears to be in no acute distress  patient is alert and oriented x3 Atraumatic normocephalic head No increased work of breathing, no audible wheezes/rhonchi Regular sinus rhythm/rate Abdomen is soft, nontender, nondistended, no CVA or suprapubic tenderness Lower extremities are symmetric without appreciable edema Grossly neurologically intact No identifiable skin lesions  No results for input(s): WBC, HGB, HCT in the last 72 hours. No results for input(s): NA, K,  CL, CO2, GLUCOSE, BUN, CREATININE, CALCIUM in the last 72 hours. No results for input(s): LABPT, INR in the last 72 hours. No results for input(s): LABURIN in the last 72 hours. No results found for this or any previous visit.  Imaging: CT Abd/Pelvis IMPRESSION: Mild left hydronephrosis secondary to a 7 x 4 x 6 mm calculus in the proximal left ureter.   Electronically Signed By: Marcey Moan M.D. On: 07/13/2024 10:44  Assessment/Recommendations: 1.  Urolithiasis: - We discussed management of left ureteral calculus including medical expulsive therapy, ESWL and ureteroscopy - After risks and benefits of ureteroscopy were discussed patient wishes to move forward with surgery today.  Risks include but are not limited to pain, bleeding, infection, stent discomfort, ureteral stricture, need for staged procedure, damage to surrounding structures. - Surgery scheduled today   Verina Galeno D Mareli Antunes

## 2024-07-18 NOTE — Op Note (Signed)
 Preoperative diagnosis: left ureteral calculus  Postoperative diagnosis: left ureteral calculus  Procedure:  Cystoscopy left ureteroscopy, laser lithotripsy, basket stone extraction left 39F x 24cm ureteral stent placement - with string left retrograde pyelography with interpretation  Surgeon: Valli Shank, MD  Anesthesia: General  Complications: None  Intraoperative findings:  Normal urethra Bilateral orthotropic ureteral orifices left retrograde pyelography demonstrated a filling defect within the left ureter consistent with the patient's known calculus without contrast seen proximal to the stone Bladder mucosa normal without masses   EBL: Minimal  Specimens: left ureteral calculus  Disposition of specimens: [facility] for stone analysis  Indication: Shari Gay is a 48 y.o.   patient with a 7mm left ureteral stone and associated left symptoms. After reviewing the management options for treatment, the patient elected to proceed with the above surgical procedure(s). We have discussed the potential benefits and risks of the procedure, side effects of the proposed treatment, the likelihood of the patient achieving the goals of the procedure, and any potential problems that might occur during the procedure or recuperation. Informed consent has been obtained.   Description of procedure:  The patient was taken to the operating room and general anesthesia was induced.  The patient was placed in the dorsal lithotomy position, prepped and draped in the usual sterile fashion, and preoperative antibiotics were administered. A preoperative time-out was performed.   Cystourethroscopy was performed.  The patient's urethra was examined and was normal.  The bladder was then systematically examined in its entirety. There was no evidence for any bladder tumors, stones, or other mucosal pathology.    Attention then turned to the left ureteral orifice and a ureteral  catheter was used to intubate the ureteral orifice.  Omnipaque contrast was injected through the ureteral catheter and a retrograde pyelogram was performed with findings as dictated above.  A 0.38 sensor guidewire was then advanced up the left ureter into the renal pelvis under fluoroscopic guidance. The 6 Fr semirigid ureteroscope was then advanced into the ureter next to the guidewire and the calculus was identified.   The stone was then fragmented with the 242 micron holmium laser.  All stones were then removed from the ureter with a 0 tip basket.  Reinspection of the ureter revealed no remaining visible stones or fragments.   The wire was then backloaded through the cystoscope and a ureteral stent was advance over the wire using Seldinger technique.  The stent was positioned appropriately under fluoroscopic and cystoscopic guidance.  The wire was then removed with an adequate stent curl noted in the renal pelvis as well as in the bladder.  The bladder was then emptied and the procedure ended.  The patient appeared to tolerate the procedure well and without complications.  The patient was able to be awakened and transferred to the recovery unit in satisfactory condition.   Disposition: The tether of the stent was left on and tucked inside the patient's vagina.  Instructions for removing the stent have been provided to the patient.

## 2024-07-19 ENCOUNTER — Encounter (HOSPITAL_COMMUNITY): Payer: Self-pay | Admitting: Urology

## 2024-07-19 MED FILL — Fentanyl Citrate Preservative Free (PF) Inj 100 MCG/2ML: INTRAMUSCULAR | Qty: 2 | Status: AC

## 2024-07-20 LAB — SURGICAL PATHOLOGY

## 2024-07-27 ENCOUNTER — Other Ambulatory Visit: Payer: Self-pay

## 2024-07-27 DIAGNOSIS — R10A2 Flank pain, left side: Secondary | ICD-10-CM | POA: Insufficient documentation

## 2024-07-27 DIAGNOSIS — M069 Rheumatoid arthritis, unspecified: Secondary | ICD-10-CM | POA: Insufficient documentation

## 2024-07-27 DIAGNOSIS — R110 Nausea: Secondary | ICD-10-CM | POA: Insufficient documentation

## 2024-07-27 DIAGNOSIS — D509 Iron deficiency anemia, unspecified: Secondary | ICD-10-CM | POA: Insufficient documentation

## 2024-08-04 ENCOUNTER — Encounter: Payer: Self-pay | Admitting: Cardiology

## 2024-08-04 ENCOUNTER — Ambulatory Visit: Attending: Cardiology | Admitting: Cardiology

## 2024-08-04 VITALS — BP 116/78 | HR 78 | Ht 64.0 in | Wt 210.0 lb

## 2024-08-04 DIAGNOSIS — R072 Precordial pain: Secondary | ICD-10-CM | POA: Insufficient documentation

## 2024-08-04 NOTE — Patient Instructions (Signed)
 Medication Instructions:  Your physician recommends that you continue on your current medications as directed. Please refer to the Current Medication list given to you today.  *If you need a refill on your cardiac medications before your next appointment, please call your pharmacy*   Lab Work: None ordered If you have labs (blood work) drawn today and your tests are completely normal, you will receive your results only by: MyChart Message (if you have MyChart) OR A paper copy in the mail If you have any lab test that is abnormal or we need to change your treatment, we will call you to review the results.  Testing/Procedures: Your physician has requested that you have an echocardiogram. Echocardiography is a painless test that uses sound waves to create images of your heart. It provides your doctor with information about the size and shape of your heart and how well your heart's chambers and valves are working. This procedure takes approximately one hour. There are no restrictions for this procedure. Please do NOT wear cologne, perfume, aftershave, or lotions (deodorant is allowed). Please arrive 15 minutes prior to your appointment time.  Please note: We ask at that you not bring children with you during ultrasound (echo/ vascular) testing. Due to room size and safety concerns, children are not allowed in the ultrasound rooms during exams. Our front office staff cannot provide observation of children in our lobby area while testing is being conducted. An adult accompanying a patient to their appointment will only be allowed in the ultrasound room at the discretion of the ultrasound technician under special circumstances. We apologize for any inconvenience.    [facility] at [facility] [address], [address] [place], [place] [address] Phone: [phone]    Stress Echocardiogram Instructions:    1. You may take all of your medications.  2. No food,  drink or tobacco products four hours prior to your test.  3. Dress prepared to exercise. Best to wear 2 piece outfit and tennis shoes. Shoes must be closed toe.  4. Please bring all current prescription medications.    If you cannot keep your appointment, please provide 24 hours notification to the Nuclear [facility], to avoid a possible $50 charge to your account.    Your physician has requested that you have an echocardiogram. Echocardiography is a painless test that uses sound waves to create images of your heart. It provides your doctor with information about the size and shape of your heart and how well your heart's chambers and valves are working. This procedure takes approximately one hour. There are no restrictions for this procedure. Please do NOT wear cologne, perfume, aftershave, or lotions (deodorant is allowed). Please arrive 15 minutes prior to your appointment time.  Please note: We ask at that you not bring children with you during ultrasound (echo/ vascular) testing. Due to room size and safety concerns, children are not allowed in the ultrasound rooms during exams. Our front office staff cannot provide observation of children in our lobby area while testing is being conducted. An adult accompanying a patient to their appointment will only be allowed in the ultrasound room at the discretion of the ultrasound technician under special circumstances. We apologize for any inconvenience.  Follow-Up: At [facility], you and your health needs are our priority.  As part of our continuing mission to provide you with exceptional heart care, we have created designated Provider Care Teams.  These Care Teams include your primary Cardiologist (physician) and Advanced Practice Providers (  APPs -  Physician Assistants and Nurse Practitioners) who all work together to provide you with the care you need, when you need it.  We recommend signing up for the patient portal called MyChart.  Sign up  information is provided on this After Visit Summary.  MyChart is used to connect with patients for Virtual Visits (Telemedicine).  Patients are able to view lab/test results, encounter notes, upcoming appointments, etc.  Non-urgent messages can be sent to your provider as well.   To learn more about what you can do with MyChart, go to [email or web address].    Your next appointment:   As needed   The format for your next appointment:   In Person  Provider:   Jennifer Crape, MD   Other Instructions Echocardiogram An echocardiogram is a test that uses sound waves (ultrasound) to produce images of the heart. Images from an echocardiogram can provide important information about: Heart size and shape. The size and thickness and movement of your heart's walls. Heart muscle function and strength. Heart valve function or if you have stenosis. Stenosis is when the heart valves are too narrow. If blood is flowing backward through the heart valves (regurgitation). A tumor or infectious growth around the heart valves. Areas of heart muscle that are not working well because of poor blood flow or injury from a heart attack. Aneurysm detection. An aneurysm is a weak or damaged part of an artery wall. The wall bulges out from the normal force of blood pumping through the body. Tell a health care provider about: Any allergies you have. All medicines you are taking, including vitamins, herbs, eye drops, creams, and over-the-counter medicines. Any blood disorders you have. Any surgeries you have had. Any medical conditions you have. Whether you are pregnant or may be pregnant. What are the risks? Generally, this is a safe test. However, problems may occur, including an allergic reaction to dye (contrast) that may be used during the test. What happens before the test? No specific preparation is needed. You may eat and drink normally. What happens during the test? You will take off your clothes  from the waist up and put on a hospital gown. Electrodes or electrocardiogram (ECG)patches may be placed on your chest. The electrodes or patches are then connected to a device that monitors your heart rate and rhythm. You will lie down on a table for an ultrasound exam. A gel will be applied to your chest to help sound waves pass through your skin. A handheld device, called a transducer, will be pressed against your chest and moved over your heart. The transducer produces sound waves that travel to your heart and bounce back (or echo back) to the transducer. These sound waves will be captured in real-time and changed into images of your heart that can be viewed on a video monitor. The images will be recorded on a computer and reviewed by your health care provider. You may be asked to change positions or hold your breath for a short time. This makes it easier to get different views or better views of your heart. In some cases, you may receive contrast through an IV in one of your veins. This can improve the quality of the pictures from your heart. The procedure may vary among health care providers and hospitals.   What can I expect after the test? You may return to your normal, everyday life, including diet, activities, and medicines, unless your health care provider tells you not to do that. Follow  these instructions at home: It is up to you to get the results of your test. Ask your health care provider, or the department that is doing the test, when your results will be ready. Keep all follow-up visits. This is important. Summary An echocardiogram is a test that uses sound waves (ultrasound) to produce images of the heart. Images from an echocardiogram can provide important information about the size and shape of your heart, heart muscle function, heart valve function, and other possible heart problems. You do not need to do anything to prepare before this test. You may eat and drink  normally. After the echocardiogram is completed, you may return to your normal, everyday life, unless your health care provider tells you not to do that. This information is not intended to replace advice given to you by your health care provider. Make sure you discuss any questions you have with your health care provider. Document Revised: 05/21/2020 Document Reviewed: 05/21/2020 Elsevier Patient Education  2021 Elsevier Inc.  Exercise Stress Echocardiogram An exercise stress echocardiogram is a test to check how well your heart is working. This test uses sound waves--often called ultrasound--and a computer to make pictures of your heart. These pictures will be made before and after you exercise. For this test, you'll walk on a treadmill or ride an exercise bike to make your heart beat faster. While you exercise, your heart rate and rhythm will be checked with an electrocardiogram, or ECG. You may have this test if: You have chest pain or other symptoms of a heart problem. You had a heart attack or heart surgery not long ago. You have heart valve problems. You have fast or uneven heartbeats. These are called palpitations. You have problems from heart failure. Tell a health care provider about: Any allergies you have. All medicines you are taking. These include vitamins, herbs, eye drops, creams, and over-the-counter medicines. Any problems you or family members have had with anesthesia. Any bleeding problems you have. Any surgeries you have had. Any medical conditions you have. Whether you are pregnant or may be pregnant. What are the risks? Your health care provider will talk with you about risks. These may include: Chest pain. Feeling dizzy or light-headed. Shortness of breath. Fast or uneven heartbeats. Vomiting or feeling like you may vomit. Heart attack. This is very rare. What happens before the test? Medicines Ask about changing or stopping any medicines you take. If you  use an inhaler, bring it with you to the test. General instructions Wear comfortable clothes and walking shoes. Follow instructions about what you may eat and drink. Do not drink or eat anything that has caffeine in it. Stop having caffeine 24 hours before the test. Do not smoke, vape, or use any products that have nicotine or tobacco for at least 4 hours before the test. If you need help quitting, talk with your provider. What happens during the test?  You'll take off your clothes from the waist up and put on a hospital gown. Sticky patches called electrodes will be put on your body. These will be connected to the ECG machine. A blood pressure cuff will be put on your arm. Before you exercise, you'll have your first heart test. You'll lie down. A wand covered in gel will be moved over your chest. Sound waves from the wand will go to the computer to make the pictures. Then, you'll start to exercise. You may walk on a treadmill or pedal a bike. Your blood pressure and heart  rate will be checked while you exercise. The exercise will get harder or faster. You'll exercise until: Your heart rate reaches a certain level. You're too tired to go on. You cannot go on because of chest pain, weakness, or feeling dizzy. You'll lie down right away so more pictures of your heart can be made. The test may vary among providers and hospitals. What happens after the test? You'll be watched closely until you leave. This includes checking your blood pressure, heart rate, breathing rate, and blood oxygen level. Ask when your test results will be ready and how to get them. You may need to call or meet with your provider to discuss your results. This information is not intended to replace advice given to you by your health care provider. Make sure you discuss any questions you have with your health care provider. Document Revised: 07/01/2023 Document Reviewed: 12/03/2022 Elsevier Patient Education  2024  Elsevier Inc. Important Information About Sugar

## 2024-08-04 NOTE — Progress Notes (Signed)
 Cardiology Office Note:    Date:  08/04/2024   ID:  Shari Gay, DOB 1976-08-09, MRN [id number]  PCP:  Joshua Franky Sharper, PA-C  Cardiologist:  Jennifer JONELLE Crape, MD   Referring MD: Bernard Franky, MD    ASSESSMENT:    1. Precordial chest pain    PLAN:    In order of problems listed above:  Primary prevention stressed with the patient.  Importance of compliance with diet medication stressed and patient verbalized standing. Cardiac murmur: Echocardiogram will be done to assess murmur heard on auscultation. Chest pain: Atypical in nature but to reassure her we will do an exercise stress echo.  She is agreeable. Obesity: Weight reduction stressed and diet emphasized and she promises to do better.  She has done very well with exercise and and diet and has lost significant weight. She will be seen in follow-up appointment on a as needed basis only.   Medication Adjustments/Labs and Tests Ordered: Current medicines are reviewed at length with the patient today.  Concerns regarding medicines are outlined above.  Orders Placed This Encounter  Procedures   EKG 12-Lead   No orders of the defined types were placed in this encounter.    History of Present Illness:    Shari Gay is a 48 y.o. female who is being seen today for the evaluation of chest pain at the request of Bernard Franky, MD. patient is a pleasant 48 year old female.  She has past medical history that is not much significant.  She is diagnosed with rheumatoid arthritis.  She has done very well with diet and exercise.  She has noted no blood based diet.  She tells me that she has lost more than 50 pounds in the past 1 year.  She occasionally has chest discomfort.  This is not related to exertion.  For this reason she is here.  She wants to intensify her exercise program.  At the time of my evaluation, the patient is alert awake oriented and in no distress.  Past Medical History:  Diagnosis Date   At high risk  for breast cancer 10/17/2020   Breast cancer screening, high risk patient 05/11/2016   Chronic throat clearing 06/04/2020   Last Assessment & Plan:    Concern over persistent throat clearing.   For the last 2 months has had a lot of throat awareness with throat clearing and cough that is mainly nonproductive.  Denies any fever.  No obvious heartburn.  She has tried many allergy medications without improvement.   EXAM shows no intranasal polyps or purulence.  Oral cavity oropharynx is unremarkable.  Indirect laryngos   Family history of breast cancer 09/25/2021   Gastroesophageal reflux disease 11/06/2020   History of menorrhagia 02/12/2021   Iron deficiency anemia    treated w/ IV iron last infusion 2022   Left flank pain    Left ureteral calculus 06/2024   Morbid obesity with BMI of 45.0-49.9, adult (HCC) 07/08/2020   Nausea    RA (rheumatoid arthritis) [facility])    rheumatologist---  dr d. mai;   seropositive RA;   Routine history and physical examination of adult 03/30/2013    Past Surgical History:  Procedure Laterality Date   CESAREAN SECTION  2011   COLONOSCOPY WITH PROPOFOL  12/2020   CYSTOSCOPY W/ URETERAL STENT PLACEMENT Left 07/18/2024   Procedure: CYSTOSCOPY, WITH RETROGRADE PYELOGRAM, URETHRAL LASER LITHOTRIPSY, AND URETERAL STENT INSERTION;  Surgeon: Elisabeth Valli BIRCH, MD;  Location: [facility] OR;  Service: Urology;  Laterality: Left;  EXAMINATION UNDER ANESTHESIA  02/23/2021   HYSTEROSCOPY WITH D & C  2000   TOTAL LAPAROSCOPIC HYSTERECTOMY WITH SALPINGECTOMY Left 02/12/2021   [facility]    Current Medications: No outpatient medications have been marked as taking for the 08/04/24 encounter (Office Visit) with Kenitra Leventhal, Jennifer SAUNDERS, MD.     Allergies:   Iron   Social History   Socioeconomic History   Marital status: Single    Spouse name: Not on file   Number of children: Not on file   Years of education: Not on file   Highest education level: Not on file  Occupational  History   Not on file  Tobacco Use   Smoking status: Never   Smokeless tobacco: Never  Vaping Use   Vaping status: Never Used  Substance and Sexual Activity   Alcohol use: Never   Drug use: Never   Sexual activity: Not Currently    Birth control/protection: Abstinence  Other Topics Concern   Not on file  Social History Narrative   Not on file   Social Drivers of Health   Financial Resource Strain: Low Risk  (02/12/2021)   Received [facility]   Overall Financial Resource Strain (CARDIA)    Difficulty of Paying Living Expenses: Not very hard  Food Insecurity: Low Risk  (04/20/2024)   Received from [facility]   Hunger Vital Sign    Within the past 12 months, you worried that your food would run out before you got money to buy more: Never true    Within the past 12 months, the food you bought just didn't last and you didn't have money to get more. : Never true  Transportation Needs: No Transportation Needs (04/20/2024)   Received from [facility]    In the past 12 months, has lack of reliable transportation kept you from medical appointments, meetings, work or from getting things needed for daily living? : No  Physical Activity: Not on file  Stress: No Stress Concern Present (02/12/2021)   Received from [facility] - Occupational Stress Questionnaire    Feeling of Stress : Only a little  Social Connections: Unknown (02/22/2022)   Received from [facility]   Social Network    Social Network: Not on file     Family History: The patient's family history is not on file.  ROS:   Please see the history of present illness.    All other systems reviewed and are negative.  EKGs/Labs/Other Studies Reviewed:    The following studies were reviewed today:  EKG Interpretation Date/Time:  Friday August 04 2024 14:31:27 EDT Ventricular Rate:  78 PR Interval:  166 QRS Duration:  78 QT Interval:  350 QTC  Calculation: 399 R Axis:   28  Text Interpretation: Normal sinus rhythm Normal ECG When compared with ECG of 10-Jul-2024 15:29, PREVIOUS ECG IS PRESENT Confirmed by Edwyna Jennifer [id number] on 08/04/2024 2:37:17 PM     Recent Labs: 07/13/2024: ALT 9; BUN 12; Creatinine, Ser 0.99; Hemoglobin 14.8; Platelets 226; Potassium 3.8; Sodium 135  Recent Lipid Panel No results found for: CHOL, TRIG, HDL, CHOLHDL, VLDL, LDLCALC, LDLDIRECT  Physical Exam:    VS:  BP 116/78   Pulse 78   Ht 5' 4 (1.626 m)   Wt 210 lb (95.3 kg)   LMP 02/13/2019   SpO2 99%   BMI 36.05 kg/m     Wt Readings from Last 3 Encounters:  08/04/24 210 lb (  95.3 kg)  07/18/24 208 lb (94.3 kg)  07/16/24 214 lb 15.2 oz (97.5 kg)     GEN: Patient is in no acute distress HEENT: Normal NECK: No JVD; No carotid bruits LYMPHATICS: No lymphadenopathy CARDIAC: S1 S2 regular, 2/6 systolic murmur at the apex. RESPIRATORY:  Clear to auscultation without rales, wheezing or rhonchi  ABDOMEN: Soft, non-tender, non-distended MUSCULOSKELETAL:  No edema; No deformity  SKIN: Warm and dry NEUROLOGIC:  Alert and oriented x 3 PSYCHIATRIC:  Normal affect    Signed, Jennifer JONELLE Crape, MD  08/04/2024 2:37 PM    [facility]

## 2024-08-23 ENCOUNTER — Ambulatory Visit (HOSPITAL_BASED_OUTPATIENT_CLINIC_OR_DEPARTMENT_OTHER)
Admission: RE | Admit: 2024-08-23 | Discharge: 2024-08-23 | Disposition: A | Discharge status: Home / Self Care | Source: Ambulatory Visit | Attending: Cardiology | Admitting: Cardiology

## 2024-08-23 DIAGNOSIS — R072 Precordial pain: Secondary | ICD-10-CM | POA: Diagnosis present

## 2024-08-23 LAB — ECHOCARDIOGRAM COMPLETE
AR max vel: 2.21 cm2
AV Area VTI: 2.13 cm2
AV Area mean vel: 2.1 cm2
AV Mean grad: 7 mmHg
AV Peak grad: 12 mmHg
Ao pk vel: 1.73 m/s
Area-P 1/2: 4.1 cm2
Calc EF: 67.9 %
MV M vel: 1.5 m/s
MV Peak grad: 9 mmHg
S' Lateral: 3 cm
Single Plane A2C EF: 67 %
Single Plane A4C EF: 69.3 %

## 2024-08-24 ENCOUNTER — Ambulatory Visit: Payer: Self-pay | Admitting: Cardiology

## 2024-09-06 ENCOUNTER — Encounter (HOSPITAL_COMMUNITY): Payer: Self-pay | Admitting: *Deleted

## 2024-09-15 ENCOUNTER — Ambulatory Visit (HOSPITAL_COMMUNITY): Attending: Cardiology

## 2024-09-15 ENCOUNTER — Telehealth (HOSPITAL_COMMUNITY): Payer: Self-pay | Admitting: Cardiology

## 2024-09-15 ENCOUNTER — Ambulatory Visit (HOSPITAL_COMMUNITY): Admission: RE | Admit: 2024-09-15

## 2024-09-15 NOTE — Telephone Encounter (Signed)
 Patient NO SHOWED Stress echo 09/15/24. We will not reach out to reschedule due to NO SHOW RATE of 25%. If patient calls back we will reschedule. Order will be removed from the WQ.

## 2024-09-21 ENCOUNTER — Telehealth (HOSPITAL_COMMUNITY): Payer: Self-pay | Admitting: Cardiology

## 2024-09-21 NOTE — Telephone Encounter (Signed)
 Patient NO SHOWED STRESS ECHO appts x 2. We will not be reaching to reschedule appointment. See below:  09/21/24 NO SHOWED X 2 @ DO NOT RESCHEDULE 25% NO SHOW RATE/LBW  09/15/24 NO SHOWED - [facility]we will not reach out to pt due to NO SHOW RATE 25%. Left VM to reschedule 12/9 CB Left VM to reschedule 12/11 CB  Order will be removed from the Echo WQ. Thank you
